# Patient Record
Sex: Female | Born: 2000 | Race: White | Hispanic: No | Marital: Single | Smoking: Never smoker
Health system: Southern US, Community
[De-identification: ages and names within clinical notes are randomized; demographics above are authoritative.]

## PROBLEM LIST (undated history)

## (undated) DIAGNOSIS — R278 Other lack of coordination: Secondary | ICD-10-CM

## (undated) DIAGNOSIS — F902 Attention-deficit hyperactivity disorder, combined type: Secondary | ICD-10-CM

## (undated) HISTORY — DX: Other lack of coordination: R27.8

## (undated) HISTORY — DX: Attention-deficit hyperactivity disorder, combined type: F90.2

---
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
## 2022-01-09 DIAGNOSIS — J209 Acute bronchitis, unspecified: Secondary | ICD-10-CM | POA: Diagnosis not present

## 2022-01-09 DIAGNOSIS — R0602 Shortness of breath: Secondary | ICD-10-CM | POA: Diagnosis not present

## 2022-01-20 DIAGNOSIS — H5213 Myopia, bilateral: Secondary | ICD-10-CM | POA: Diagnosis not present

## 2022-03-27 DIAGNOSIS — L299 Pruritus, unspecified: Secondary | ICD-10-CM | POA: Diagnosis not present

## 2022-03-27 DIAGNOSIS — J309 Allergic rhinitis, unspecified: Secondary | ICD-10-CM | POA: Diagnosis not present

## 2022-08-06 DIAGNOSIS — S90862A Insect bite (nonvenomous), left foot, initial encounter: Secondary | ICD-10-CM | POA: Diagnosis not present

## 2022-08-06 DIAGNOSIS — W57XXXA Bitten or stung by nonvenomous insect and other nonvenomous arthropods, initial encounter: Secondary | ICD-10-CM | POA: Diagnosis not present

## 2022-12-04 DIAGNOSIS — R112 Nausea with vomiting, unspecified: Secondary | ICD-10-CM | POA: Diagnosis not present

## 2022-12-04 DIAGNOSIS — R42 Dizziness and giddiness: Secondary | ICD-10-CM | POA: Diagnosis not present

## 2022-12-04 DIAGNOSIS — Z3202 Encounter for pregnancy test, result negative: Secondary | ICD-10-CM | POA: Diagnosis not present

## 2022-12-22 DIAGNOSIS — Z0000 Encounter for general adult medical examination without abnormal findings: Secondary | ICD-10-CM | POA: Diagnosis not present

## 2022-12-22 DIAGNOSIS — Z1159 Encounter for screening for other viral diseases: Secondary | ICD-10-CM | POA: Diagnosis not present

## 2022-12-22 DIAGNOSIS — Z113 Encounter for screening for infections with a predominantly sexual mode of transmission: Secondary | ICD-10-CM | POA: Diagnosis not present

## 2022-12-22 DIAGNOSIS — N760 Acute vaginitis: Secondary | ICD-10-CM | POA: Diagnosis not present

## 2022-12-22 DIAGNOSIS — Z118 Encounter for screening for other infectious and parasitic diseases: Secondary | ICD-10-CM | POA: Diagnosis not present

## 2023-02-01 DIAGNOSIS — Z3009 Encounter for other general counseling and advice on contraception: Secondary | ICD-10-CM | POA: Diagnosis not present

## 2023-02-01 DIAGNOSIS — N925 Other specified irregular menstruation: Secondary | ICD-10-CM | POA: Diagnosis not present

## 2023-02-01 DIAGNOSIS — Z3202 Encounter for pregnancy test, result negative: Secondary | ICD-10-CM | POA: Diagnosis not present

## 2023-02-20 DIAGNOSIS — L309 Dermatitis, unspecified: Secondary | ICD-10-CM | POA: Diagnosis not present

## 2023-03-05 ENCOUNTER — Ambulatory Visit
Admission: EM | Admit: 2023-03-05 | Discharge: 2023-03-05 | Disposition: A | Discharge status: Home / Self Care | Payer: Medicaid Other

## 2023-03-05 ENCOUNTER — Encounter: Payer: Self-pay | Admitting: Emergency Medicine

## 2023-03-05 DIAGNOSIS — J0100 Acute maxillary sinusitis, unspecified: Secondary | ICD-10-CM

## 2023-03-05 DIAGNOSIS — J309 Allergic rhinitis, unspecified: Secondary | ICD-10-CM

## 2023-03-05 DIAGNOSIS — R059 Cough, unspecified: Secondary | ICD-10-CM

## 2023-03-05 LAB — POCT URINALYSIS DIP (MANUAL ENTRY)
Glucose, UA: NEGATIVE mg/dL
Leukocytes, UA: NEGATIVE
Nitrite, UA: NEGATIVE
Protein Ur, POC: 30 mg/dL — AB
Spec Grav, UA: >=1.03 — AB (ref 1.010–1.025)
Urobilinogen, UA: 0.2 E.U./dL
pH, UA: 6 (ref 5.0–8.0)

## 2023-03-05 MED ORDER — FEXOFENADINE HCL 180 MG PO TABS: 180.0000 mg | ORAL_TABLET | Freq: Every day | ORAL | 0 refills | Status: AC

## 2023-03-05 MED ORDER — AMOXICILLIN-POT CLAVULANATE 875-125 MG PO TABS: 1.0000 | ORAL_TABLET | Freq: Two times a day (BID) | ORAL | 0 refills | Status: AC

## 2023-03-05 MED ORDER — BENZONATATE 200 MG PO CAPS: 200.0000 mg | ORAL_CAPSULE | Freq: Three times a day (TID) | ORAL | 0 refills | Status: AC | PRN

## 2023-03-05 MED ORDER — PREDNISONE 20 MG PO TABS: ORAL_TABLET | ORAL | 0 refills | Status: DC

## 2023-03-05 NOTE — ED Triage Notes (Signed)
Abdominal pain, congestion since Friday N/V since Thursday  Pt changed to oral contraception  at the beginning at April  Diarrhea x 3 today  No nausea in triage

## 2023-03-05 NOTE — ED Provider Notes (Signed)
[facility]    CSN: [id number] Arrival date & time: 03/05/23  1440      History   Chief Complaint Chief Complaint  Patient presents with   Abdominal Pain    HPI Christine Yoder is a 22 y.o. female.   HPI 22 year old female presents with nasal sinus congestion and cough for 4 days.   PMH significant for ADHD.  Past Medical History:  Diagnosis Date   ADHD (attention deficit hyperactivity disorder), combined type 02/18/2016   Dysgraphia 02/18/2016    Patient Active Problem List   Diagnosis Date Noted   ADHD (attention deficit hyperactivity disorder), combined type 02/18/2016   Dysgraphia 02/18/2016    History reviewed. No pertinent surgical history.  OB History   No obstetric history on file.      Home Medications    Prior to Admission medications   Medication Sig Start Date End Date Taking? Authorizing Provider  amoxicillin-clavulanate (AUGMENTIN) 875-125 MG tablet Take 1 tablet by mouth 2 (two) times daily for 10 days. 03/05/23 03/15/23 Yes Trevor Iha, FNP  benzonatate (TESSALON) 200 MG capsule Take 1 capsule (200 mg total) by mouth 3 (three) times daily as needed for up to 7 days. 03/05/23 03/12/23 Yes Trevor Iha, FNP  drospirenone-ethinyl estradiol Fatima Blank) 3-0.03 MG tablet Take by mouth. 02/01/23  Yes [provider]  fexofenadine (ALLEGRA ALLERGY) 180 MG tablet Take 1 tablet (180 mg total) by mouth daily for 15 days. 03/05/23 03/20/23 Yes Trevor Iha, FNP  predniSONE (DELTASONE) 20 MG tablet Take 3 tabs PO daily x 5 days. 03/05/23  Yes Trevor Iha, FNP  etonogestrel (NEXPLANON) 68 MG IMPL implant 1 each by Subdermal route once. Patient not taking: Reported on 03/05/2023    [provider]  guanFACINE (INTUNIV) 4 MG TB24 ER tablet TAKE 1 TABLET BY MOUTH EVERY MORNING. Patient not taking: Reported on 03/05/2023 08/17/21   Lorina Rabon, NP  methylphenidate (CONCERTA) 54 MG PO CR tablet Take 1 tablet (54 mg total) by mouth every  morning. Patient not taking: Reported on 03/05/2023 08/18/21   Leticia Penna, NP    Family History Family History  Problem Relation Age of Onset   Heart disease Paternal Grandmother        Dec 2017 sudden MI    Social History Social History   Tobacco Use   Smoking status: Never    Passive exposure: Yes   Smokeless tobacco: Never  Vaping Use   Vaping Use: Never used  Substance Use Topics   Alcohol use: No    Alcohol/week: 0.0 standard drinks of alcohol   Drug use: No     Allergies   Patient has no known allergies.   Review of Systems Review of Systems  All other systems reviewed and are negative.    Physical Exam Triage Vital Signs ED Triage Vitals  Enc Vitals Group     BP      Pulse      Resp      Temp      Temp src      SpO2      Weight      Height      Head Circumference      Peak Flow      Pain Score      Pain Loc      Pain Edu?      Excl. in GC?    No data found.  Updated Vital Signs BP 109/73 (BP Location: Right Arm)  Pulse 83   Temp 98.3 F (36.8 C) (Oral)   Resp 16   Wt 153 lb (69.4 kg)   LMP 03/02/2023 (Exact Date)   SpO2 98%   Physical Exam Vitals and nursing note reviewed.  Constitutional:      General: She is not in acute distress.    Appearance: Normal appearance. She is normal weight. She is not ill-appearing.  HENT:     Head: Normocephalic and atraumatic.     Right Ear: Tympanic membrane, ear canal and external ear normal.     Left Ear: Tympanic membrane, ear canal and external ear normal.     Nose: Nose normal.     Comments: Turbinates are erythematous/edematous with clear rhinorrhea noted    Mouth/Throat:     Mouth: Mucous membranes are moist.     Pharynx: Oropharynx is clear.     Comments: Significant amount of clear drainage of posterior oropharynx Eyes:     Extraocular Movements: Extraocular movements intact.     Conjunctiva/sclera: Conjunctivae normal.     Pupils: Pupils are equal, round, and reactive to light.   Cardiovascular:     Rate and Rhythm: Normal rate and regular rhythm.     Pulses: Normal pulses.     Heart sounds: Normal heart sounds. No murmur heard. Pulmonary:     Effort: Pulmonary effort is normal.     Breath sounds: Normal breath sounds. No wheezing, rhonchi or rales.     Comments: Infrequent nonproductive cough noted on exam Abdominal:     General: Bowel sounds are normal.  Musculoskeletal:        General: Normal range of motion.     Cervical back: Normal range of motion and neck supple.  Skin:    General: Skin is warm and dry.  Neurological:     General: No focal deficit present.     Mental Status: She is alert and oriented to person, place, and time. Mental status is at baseline.      UC Treatments / Results  Labs (all labs ordered are listed, but only abnormal results are displayed) Labs Reviewed  POCT URINALYSIS DIP (MANUAL ENTRY) - Abnormal; Notable for the following components:      Result Value   Clarity, UA cloudy (*)    Bilirubin, UA small (*)    Ketones, POC UA small (15) (*)    Spec Grav, UA >=1.030 (*)    Blood, UA large (*)    Protein Ur, POC =30 (*)    All other components within normal limits    EKG   Radiology No results found.  Procedures Procedures (including critical care time)  Medications Ordered in UC Medications - No data to display  Initial Impression / Assessment and Plan / UC Course  I have reviewed the triage vital signs and the nursing notes.  Pertinent labs & imaging results that were available during my care of the patient were reviewed by me and considered in my medical decision making (see chart for details).     MDM: 1.  Acute maxillary sinusitis, recurrence not specified-Rx'd Augmentin 875/125 mg twice daily x 10 days; 2.  Cough, unspecified type-Rx prednisone 60 mg daily x 5 days, Tessalon Perles 200 mg 3 times daily, as needed; 3.  Allergic rhinitis, unspecified seasonality, unspecified trigger-Rx'd Allegra 180 mg  daily x 5 days, then as needed for concurrent postnasal drainage/drip. Instructed patient to take medications as directed with food to completion.  Advised patient to take Allegra and prednisone  with first dose of Augmentin for the next 5 of 10 days.  Advised may use Allegra as needed afterwards for concurrent postnasal drip/drainage.  Advised may use Tessalon daily or as needed for cough.  Encouraged increase daily water intake to 64 ounces per day while taking these medications. Advised if symptoms worsen and/or unresolved please follow-up with PCP or here for further evaluation.  Patient advised of UA analysis prior to discharge encouraged increased daily water intake patient is currently on menstrual cycle.  Patient discharged home, hemodynamically stable.  Work note provided to patient prior to discharge per request. Final Clinical Impressions(s) / UC Diagnoses   Final diagnoses:  Acute maxillary sinusitis, recurrence not specified  Allergic rhinitis, unspecified seasonality, unspecified trigger  Cough, unspecified type     Discharge Instructions      Instructed patient to take medications as directed with food to completion.  Advised patient to take Allegra and prednisone with first dose of Augmentin for the next 5 of 10 days.  Advised may use Allegra as needed afterwards for concurrent postnasal drip/drainage.  Advised may use Tessalon daily or as needed for cough.  Encouraged increase daily water intake to 64 ounces per day while taking these medications. Advised if symptoms worsen and/or unresolved please follow-up with PCP or here for further evaluation.     ED Prescriptions     Medication Sig Dispense Auth. Provider   amoxicillin-clavulanate (AUGMENTIN) 875-125 MG tablet Take 1 tablet by mouth 2 (two) times daily for 10 days. 20 tablet Trevor Iha, FNP   predniSONE (DELTASONE) 20 MG tablet Take 3 tabs PO daily x 5 days. 15 tablet Trevor Iha, FNP   fexofenadine [facility]  ALLERGY) 180 MG tablet Take 1 tablet (180 mg total) by mouth daily for 15 days. 15 tablet Trevor Iha, FNP   benzonatate (TESSALON) 200 MG capsule Take 1 capsule (200 mg total) by mouth 3 (three) times daily as needed for up to 7 days. 40 capsule Trevor Iha, FNP      PDMP not reviewed this encounter.   Trevor Iha, FNP 03/05/23 1537

## 2023-03-05 NOTE — Discharge Instructions (Addendum)
Instructed patient to take medications as directed with food to completion.  Advised patient to take Allegra and prednisone with first dose of Augmentin for the next 5 of 10 days.  Advised may use Allegra as needed afterwards for concurrent postnasal drip/drainage.  Advised may use Tessalon daily or as needed for cough.  Encouraged increase daily water intake to 64 ounces per day while taking these medications. Advised if symptoms worsen and/or unresolved please follow-up with PCP or here for further evaluation.

## 2023-03-12 ENCOUNTER — Telehealth: Payer: Self-pay

## 2023-03-12 NOTE — Telephone Encounter (Signed)
LVM for patient to call back. AS, CMA 

## 2023-06-08 DIAGNOSIS — A084 Viral intestinal infection, unspecified: Secondary | ICD-10-CM | POA: Diagnosis not present

## 2023-06-08 DIAGNOSIS — Z3202 Encounter for pregnancy test, result negative: Secondary | ICD-10-CM | POA: Diagnosis not present

## 2023-06-08 DIAGNOSIS — R1110 Vomiting, unspecified: Secondary | ICD-10-CM | POA: Diagnosis not present

## 2023-12-08 DIAGNOSIS — J069 Acute upper respiratory infection, unspecified: Secondary | ICD-10-CM | POA: Diagnosis not present

## 2023-12-08 DIAGNOSIS — J029 Acute pharyngitis, unspecified: Secondary | ICD-10-CM | POA: Diagnosis not present

## 2023-12-31 DIAGNOSIS — N911 Secondary amenorrhea: Secondary | ICD-10-CM | POA: Diagnosis not present

## 2023-12-31 DIAGNOSIS — R635 Abnormal weight gain: Secondary | ICD-10-CM | POA: Diagnosis not present

## 2024-02-04 DIAGNOSIS — Z118 Encounter for screening for other infectious and parasitic diseases: Secondary | ICD-10-CM | POA: Diagnosis not present

## 2024-02-04 DIAGNOSIS — Z113 Encounter for screening for infections with a predominantly sexual mode of transmission: Secondary | ICD-10-CM | POA: Diagnosis not present

## 2024-02-04 DIAGNOSIS — Z0000 Encounter for general adult medical examination without abnormal findings: Secondary | ICD-10-CM | POA: Diagnosis not present

## 2024-02-04 DIAGNOSIS — N912 Amenorrhea, unspecified: Secondary | ICD-10-CM | POA: Diagnosis not present

## 2024-03-24 ENCOUNTER — Encounter (HOSPITAL_COMMUNITY): Payer: Self-pay

## 2024-03-24 ENCOUNTER — Ambulatory Visit (HOSPITAL_COMMUNITY)
Admission: EM | Admit: 2024-03-24 | Discharge: 2024-03-24 | Disposition: A | Discharge status: Home / Self Care | Attending: Family Medicine | Admitting: Family Medicine

## 2024-03-24 DIAGNOSIS — K529 Noninfective gastroenteritis and colitis, unspecified: Secondary | ICD-10-CM | POA: Diagnosis not present

## 2024-03-24 LAB — POCT URINALYSIS DIP (MANUAL ENTRY)
Blood, UA: NEGATIVE
Glucose, UA: NEGATIVE mg/dL
Leukocytes, UA: NEGATIVE
Nitrite, UA: NEGATIVE
Protein Ur, POC: 30 mg/dL — AB
Spec Grav, UA: 1.025 (ref 1.010–1.025)
Urobilinogen, UA: 0.2 U/dL
pH, UA: 6 (ref 5.0–8.0)

## 2024-03-24 LAB — POCT URINE PREGNANCY: Preg Test, Ur: NEGATIVE

## 2024-03-24 MED ORDER — ONDANSETRON 4 MG PO TBDP
4.0000 mg | ORAL_TABLET | Freq: Once | ORAL | Status: AC
  Administered 2024-03-24: 4 mg via ORAL

## 2024-03-24 MED ORDER — ONDANSETRON 4 MG PO TBDP: 4.0000 mg | ORAL_TABLET | Freq: Three times a day (TID) | ORAL | 0 refills | Status: DC | PRN

## 2024-03-24 MED ORDER — ONDANSETRON 4 MG PO TBDP
ORAL_TABLET | ORAL | Status: AC
  Filled 2024-03-24: qty 1

## 2024-03-24 NOTE — ED Provider Notes (Signed)
 [facility]   Note:  This document was prepared using Dragon voice recognition software and may include unintentional dictation errors.  MRN: [id number] DOB: July 02, 2001  Subjective:   Christine Yoder is a 23 y.o. female presenting for nausea/vomiting and abdominal cramping since Friday.  Patient reports that she began having diminished appetite on Friday and when she woke up on Saturday and began to eat she immediately had an episode of vomiting.  Patient reports constant abdominal cramping since vomiting began.  Patient denies any diarrhea, dysuria, increased urinary frequency, flank pain, fever.  Patient has not taken any over-the-counter or previously prescribed medication to treat nausea and vomiting symptoms or cramping.  Patient has past history of acid reflux and currently is taking omeprazole for symptoms.  No current facility-administered medications for this encounter.  Current Outpatient Medications:    ondansetron  (ZOFRAN -ODT) 4 MG disintegrating tablet, Take 1 tablet (4 mg total) by mouth every 8 (eight) hours as needed for nausea or vomiting., Disp: 20 tablet, Rfl: 0   drospirenone-ethinyl estradiol (YASMIN) 3-0.03 MG tablet, Take by mouth., Disp: , Rfl:    etonogestrel  (NEXPLANON ) 68 MG IMPL implant, 1 each by Subdermal route once. (Patient not taking: Reported on 03/05/2023), Disp: , Rfl:    fexofenadine  (ALLEGRA  ALLERGY) 180 MG tablet, Take 1 tablet (180 mg total) by mouth daily for 15 days., Disp: 15 tablet, Rfl: 0   guanFACINE  (INTUNIV ) 4 MG TB24 ER tablet, TAKE 1 TABLET BY MOUTH EVERY MORNING. (Patient not taking: Reported on 03/05/2023), Disp: 90 tablet, Rfl: 0   methylphenidate  (CONCERTA ) 54 MG PO CR tablet, Take 1 tablet (54 mg total) by mouth every morning. (Patient not taking: Reported on 03/05/2023), Disp: 30 tablet, Rfl: 0   predniSONE  (DELTASONE ) 20 MG tablet, Take 3 tabs PO daily x 5 days., Disp: 15 tablet, Rfl: 0   No Known Allergies  Past Medical  History:  Diagnosis Date   ADHD (attention deficit hyperactivity disorder), combined type 02/18/2016   Dysgraphia 02/18/2016     History reviewed. No pertinent surgical history.  Family History  Problem Relation Age of Onset   Heart disease Paternal Grandmother        Dec 2017 sudden MI    Social History   Tobacco Use   Smoking status: Never    Passive exposure: Yes   Smokeless tobacco: Never  Vaping Use   Vaping status: Never Used  Substance Use Topics   Alcohol use: No    Alcohol/week: 0.0 standard drinks of alcohol   Drug use: No    ROS Refer to HPI for ROS details.  Objective:   Vitals: BP 126/83 (BP Location: Right Arm)   Pulse (!) 58   Temp 98.3 F (36.8 C) (Oral)   Resp 14   LMP 03/11/2024 (Approximate)   SpO2 98%   Physical Exam Vitals and nursing note reviewed.  Constitutional:      General: She is not in acute distress.    Appearance: She is well-developed. She is not ill-appearing or toxic-appearing.  HENT:     Head: Normocephalic and atraumatic.     Mouth/Throat:     Mouth: Mucous membranes are moist.  Cardiovascular:     Rate and Rhythm: Normal rate.  Pulmonary:     Effort: Pulmonary effort is normal. No respiratory distress.  Abdominal:     General: There is no distension.     Palpations: Abdomen is soft.     Tenderness: There is no abdominal tenderness. There is no right CVA  tenderness, left CVA tenderness, guarding or rebound.  Skin:    General: Skin is warm and dry.  Neurological:     General: No focal deficit present.     Mental Status: She is alert and oriented to person, place, and time.  Psychiatric:        Mood and Affect: Mood normal.        Behavior: Behavior normal.     Procedures  Results for orders placed or performed during the hospital encounter of 03/24/24 (from the past 24 hours)  POCT urine pregnancy     Status: None   Collection Time: 03/24/24  8:15 PM  Result Value Ref Range   Preg Test, Ur Negative Negative   POCT urinalysis dipstick     Status: Abnormal   Collection Time: 03/24/24  8:15 PM  Result Value Ref Range   Color, UA orange (A) yellow   Clarity, UA hazy (A) clear   Glucose, UA negative negative mg/dL   Bilirubin, UA moderate (A) negative   Ketones, POC UA >= (160) (A) negative mg/dL   Spec Grav, UA 1.610 9.604 - 1.025   Blood, UA negative negative   pH, UA 6.0 5.0 - 8.0   Protein Ur, POC =30 (A) negative mg/dL   Urobilinogen, UA 0.2 0.2 or 1.0 E.U./dL   Nitrite, UA Negative Negative   Leukocytes, UA Negative Negative    No results found.   Assessment and Plan :     Discharge Instructions       1. Gastroenteritis (Primary) - POCT urine pregnancy performed in UC is negative - POCT urinalysis dipstick shows no leukocytes, no nitrite, no blood, no sign of urinary tract infection - ondansetron  (ZOFRAN -ODT) disintegrating tablet 4 mg given in clinic for acute nausea and vomiting - ondansetron  (ZOFRAN -ODT) 4 MG disintegrating tablet; Take 1 tablet (4 mg total) by mouth every 8 (eight) hours as needed for nausea or vomiting.  Dispense: 20 tablet; Refill: 0 -Continue to monitor symptoms for any change in severity if there is any escalation of current symptoms or development of new symptoms follow-up in ER for further evaluation and management.    [name] B [name]   [name], [place], [place] 03/24/24 2043

## 2024-03-24 NOTE — ED Triage Notes (Signed)
 Patient c/o vomiting, chills, abdominal cramping x 4 days. Patient states she is able to tolerate water and some fruit. Patient states she only has N/V when she tries to eat. Patient drinking water in triage.   Patient states she has nota taken anything for her symptoms.

## 2024-03-24 NOTE — ED Notes (Signed)
  Patient reports that she threw the Zofran  up that she put under her tongue because she couldn't stand the taste.

## 2024-03-24 NOTE — Discharge Instructions (Addendum)
  1. Gastroenteritis (Primary) - POCT urine pregnancy performed in UC is negative - POCT urinalysis dipstick shows no leukocytes, no nitrite, no blood, no sign of urinary tract infection - ondansetron  (ZOFRAN -ODT) disintegrating tablet 4 mg given in clinic for acute nausea and vomiting - ondansetron  (ZOFRAN -ODT) 4 MG disintegrating tablet; Take 1 tablet (4 mg total) by mouth every 8 (eight) hours as needed for nausea or vomiting.  Dispense: 20 tablet; Refill: 0 -Continue to monitor symptoms for any change in severity if there is any escalation of current symptoms or development of new symptoms follow-up in ER for further evaluation and management.

## 2024-03-28 ENCOUNTER — Ambulatory Visit
Admission: RE | Admit: 2024-03-28 | Discharge: 2024-03-28 | Disposition: A | Discharge status: Home / Self Care | Source: Ambulatory Visit | Attending: Family Medicine | Admitting: Family Medicine

## 2024-03-28 VITALS — BP 137/89 | HR 82 | Temp 98.4°F | Resp 17 | Ht 63.0 in | Wt 159.0 lb

## 2024-03-28 DIAGNOSIS — R110 Nausea: Secondary | ICD-10-CM

## 2024-03-28 DIAGNOSIS — R1011 Right upper quadrant pain: Secondary | ICD-10-CM | POA: Diagnosis not present

## 2024-03-28 LAB — POCT URINALYSIS DIP (MANUAL ENTRY)
Blood, UA: NEGATIVE
Glucose, UA: NEGATIVE mg/dL
Leukocytes, UA: NEGATIVE
Nitrite, UA: NEGATIVE
Protein Ur, POC: 100 mg/dL — AB
Spec Grav, UA: 1.025 (ref 1.010–1.025)
Urobilinogen, UA: 1 U/dL
pH, UA: 6 (ref 5.0–8.0)

## 2024-03-28 LAB — POCT URINE PREGNANCY: Preg Test, Ur: NEGATIVE

## 2024-03-28 MED ORDER — PROMETHAZINE HCL 25 MG PO TABS: 25.0000 mg | ORAL_TABLET | Freq: Four times a day (QID) | ORAL | 1 refills | Status: AC | PRN

## 2024-03-28 NOTE — Discharge Instructions (Signed)
 Begin clear liquids during the weekend.  If symptoms become significantly worse during the night or over the weekend, proceed to the local emergency room.

## 2024-03-28 NOTE — ED Triage Notes (Signed)
 Pt states that she has some nausea and vomiting. X5 days  Pt states that she has been running a fever as well.

## 2024-03-28 NOTE — ED Provider Notes (Signed)
 [facility]    CSN: [id number] Arrival date & time: 03/28/24  1720      History   Chief Complaint Chief Complaint  Patient presents with   Nausea    Been vomiting for 5 days can't take zolfran Can't eat anything without throwing it up - Entered by patient    HPI Christine Yoder is a 23 y.o. female.   Patient complains of persistent nausea, abdominal cramps, and occasional vomiting for about a week. During the past two days she has had chills/sweats and low grade fever 100.2. She notes that she has been able to tolerate some liquids.  Zofran  has not been very helpful for her nausea.  She was evaluated in the [facility] urgent care four days ago for symptoms that had developed about 3 to 4 days prior.  At that time a urine pregnancy test was negative and urinalysis unremarkable except for moderate bilirubin.  Patient's last menstrual period was 03/11/2024 (approximate).  She has history of GERD for which she takes OTC omeprazole.  She denies urinary symptoms, pelvic pain, vaginal discharge, and changes in bowel movements.  The history is provided by the patient.    Past Medical History:  Diagnosis Date   ADHD (attention deficit hyperactivity disorder), combined type 02/18/2016   Dysgraphia 02/18/2016    Patient Active Problem List   Diagnosis Date Noted   ADHD (attention deficit hyperactivity disorder), combined type 02/18/2016   Dysgraphia 02/18/2016    History reviewed. No pertinent surgical history.  OB History   No obstetric history on file.      Home Medications    Prior to Admission medications   Medication Sig Start Date End Date Taking? Authorizing Provider  drospirenone-ethinyl estradiol (YASMIN) 3-0.03 MG tablet Take by mouth. 02/01/23  Yes [provider]  ondansetron  (ZOFRAN -ODT) 4 MG disintegrating tablet Take 1 tablet (4 mg total) by mouth every 8 (eight) hours as needed for nausea or vomiting. 03/24/24  Yes Reddick, Johnathan  B, NP  predniSONE  (DELTASONE ) 20 MG tablet Take 3 tabs PO daily x 5 days. 03/05/23  Yes Leonides Ramp, FNP  promethazine (PHENERGAN) 25 MG tablet Take 1 tablet (25 mg total) by mouth every 6 (six) hours as needed for nausea or vomiting. 03/28/24  Yes Leon Rajas, MD  etonogestrel  (NEXPLANON ) 68 MG IMPL implant 1 each by Subdermal route once. Patient not taking: Reported on 03/05/2023    [provider]  fexofenadine  (ALLEGRA  ALLERGY) 180 MG tablet Take 1 tablet (180 mg total) by mouth daily for 15 days. 03/05/23 03/20/23  Leonides Ramp, FNP  guanFACINE  (INTUNIV ) 4 MG TB24 ER tablet TAKE 1 TABLET BY MOUTH EVERY MORNING. Patient not taking: Reported on 03/05/2023 08/17/21   Jocelyne Mull, NP  methylphenidate  (CONCERTA ) 54 MG PO CR tablet Take 1 tablet (54 mg total) by mouth every morning. Patient not taking: Reported on 03/05/2023 08/18/21   Sherrell Dodrill, NP    Family History Family History  Problem Relation Age of Onset   Heart disease Paternal Grandmother        Dec 2017 sudden MI    Social History Social History   Tobacco Use   Smoking status: Never    Passive exposure: Yes   Smokeless tobacco: Never  Vaping Use   Vaping status: Former  Substance Use Topics   Alcohol use: No    Alcohol/week: 0.0 standard drinks of alcohol   Drug use: No     Allergies   Patient  has no known allergies.   Review of Systems Review of Systems  Constitutional:  Positive for activity change, appetite change, chills, diaphoresis, fatigue and fever.  HENT: Negative.    Eyes: Negative.   Respiratory: Negative.    Cardiovascular: Negative.   Gastrointestinal:  Positive for nausea and vomiting. Negative for abdominal distention, abdominal pain, blood in stool, constipation and diarrhea.  Genitourinary: Negative.   Musculoskeletal: Negative.   Skin: Negative.   Neurological: Negative.      Physical Exam Triage Vital Signs ED Triage Vitals  Encounter Vitals Group     BP  03/28/24 1738 137/89     Systolic BP Percentile --      Diastolic BP Percentile --      Pulse Rate 03/28/24 1738 82     Resp 03/28/24 1738 17     Temp 03/28/24 1738 98.4 F (36.9 C)     Temp Source 03/28/24 1738 Oral     SpO2 03/28/24 1738 96 %     Weight 03/28/24 1736 159 lb (72.1 kg)     Height 03/28/24 1736 5\' 3"  (1.6 m)     Head Circumference --      Peak Flow --      Pain Score 03/28/24 1736 0     Pain Loc --      Pain Education --      Exclude from Growth Chart --    No data found.  Updated Vital Signs BP 137/89 (BP Location: Right Arm)   Pulse 82   Temp 98.4 F (36.9 C) (Oral)   Resp 17   Ht 5\' 3"  (1.6 m)   Wt 72.1 kg   LMP 03/11/2024 (Approximate)   SpO2 96%   BMI 28.17 kg/m   Visual Acuity Right Eye Distance:   Left Eye Distance:   Bilateral Distance:    Right Eye Near:   Left Eye Near:    Bilateral Near:     Physical Exam Vitals and nursing note reviewed.  Constitutional:      General: She is not in acute distress.    Appearance: She is not ill-appearing.  HENT:     Head: Normocephalic.     Mouth/Throat:     Mouth: Mucous membranes are moist.  Eyes:     Conjunctiva/sclera: Conjunctivae normal.     Pupils: Pupils are equal, round, and reactive to light.  Cardiovascular:     Rate and Rhythm: Normal rate and regular rhythm.     Heart sounds: Normal heart sounds.  Pulmonary:     Effort: Pulmonary effort is normal.     Breath sounds: Normal breath sounds.  Abdominal:     General: Abdomen is flat. Bowel sounds are normal. There is no distension.     Palpations: Abdomen is soft. There is no hepatomegaly or splenomegaly.     Tenderness: There is abdominal tenderness in the right upper quadrant. There is no right CVA tenderness, left CVA tenderness, guarding or rebound. Positive signs include Murphy's sign. Negative signs include Rovsing's sign and McBurney's sign.     Hernia: No hernia is present.    Musculoskeletal:     Cervical back: Neck  supple.  Lymphadenopathy:     Cervical: No cervical adenopathy.  Neurological:     Mental Status: She is alert.      UC Treatments / Results  Labs (all labs ordered are listed, but only abnormal results are displayed) Labs Reviewed  POCT URINALYSIS DIP (MANUAL ENTRY) - Abnormal; Notable for the  following components:      Result Value   Color, UA red (*)    Bilirubin, UA moderate (*)    Ketones, POC UA moderate (40) (*)    Protein Ur, POC =100 (*)    All other components within normal limits  POCT URINE PREGNANCY - Normal    EKG   Radiology No results found.  Procedures Procedures (including critical care time)  Medications Ordered in UC Medications - No data to display  Initial Impression / Assessment and Plan / UC Course  I have reviewed the triage vital signs and the nursing notes.  Pertinent labs & imaging results that were available during my care of the patient were reviewed by me and considered in my medical decision making (see chart for details).    Note persistent moderate bilirubinuria. Suspect biliary colic.  Rx for Phenergan 25mg  for nausea. Schedule [facility] US  [facility].  Followup here after scan.  Final Clinical Impressions(s) / UC Diagnoses   Final diagnoses:  Right upper quadrant abdominal pain  Nausea     Discharge Instructions      Begin clear liquids during the weekend.  If symptoms become significantly worse during the night or over the weekend, proceed to the local emergency room.    ED Prescriptions     Medication Sig Dispense Auth. Provider   promethazine (PHENERGAN) 25 MG tablet Take 1 tablet (25 mg total) by mouth every 6 (six) hours as needed for nausea or vomiting. 15 tablet Leon Rajas, MD         Leon Rajas, MD 03/29/24 1310

## 2024-04-02 ENCOUNTER — Ambulatory Visit

## 2024-04-02 DIAGNOSIS — R1011 Right upper quadrant pain: Secondary | ICD-10-CM

## 2024-04-03 ENCOUNTER — Ambulatory Visit (HOSPITAL_COMMUNITY): Payer: Self-pay

## 2024-04-03 ENCOUNTER — Telehealth: Payer: Self-pay

## 2024-04-03 NOTE — Telephone Encounter (Signed)
 Per Dr. Nicholette Barley US  results normal

## 2024-04-09 ENCOUNTER — Ambulatory Visit
Admission: RE | Admit: 2024-04-09 | Discharge: 2024-04-09 | Disposition: A | Discharge status: Home / Self Care | Source: Ambulatory Visit | Attending: Family Medicine | Admitting: Family Medicine

## 2024-04-09 VITALS — BP 130/67 | HR 88 | Temp 100.4°F | Resp 16

## 2024-04-09 DIAGNOSIS — R509 Fever, unspecified: Secondary | ICD-10-CM

## 2024-04-09 DIAGNOSIS — R0981 Nasal congestion: Secondary | ICD-10-CM

## 2024-04-09 MED ORDER — ACETAMINOPHEN 500 MG PO TABS
1000.0000 mg | ORAL_TABLET | Freq: Four times a day (QID) | ORAL | Status: DC | PRN
Start: 2024-04-09 — End: 2024-04-10
  Administered 2024-04-09: 1000 mg via ORAL

## 2024-04-09 MED ORDER — PREDNISONE 20 MG PO TABS: ORAL_TABLET | ORAL | 0 refills | Status: AC

## 2024-04-09 NOTE — ED Triage Notes (Signed)
 Pt presents to [facility] with co cough congestion since Monday. Pt reports she has been taking otc vicks flu. Pt is concerned for a sinus infection.

## 2024-04-09 NOTE — Discharge Instructions (Addendum)
 Advised patient to take medication as directed with food to completion.  Advised patient may take OTC Tylenol  1000 mg every 6 hours for fever (oral temperature greater than 100.3).  Encouraged to increase daily water intake to 64 ounces per day while taking these medications.  Advised if symptoms worsen and/or unresolved please follow-up with your PCP or here for further evaluation.

## 2024-04-09 NOTE — ED Provider Notes (Signed)
 [facility]    CSN: [id number] Arrival date & time: 04/09/24  1850      History   Chief Complaint Chief Complaint  Patient presents with   Nasal Congestion    Feels like another sinus infection - Entered by patient    HPI Christine Yoder is a 23 y.o. female.   HPI 23 year old female presents with sinus nasal congestion, sinus pressure, and fever for 2 days.  PMH significant for obesity and ADHD.  Patient is accompanied by her 2 friends this evening.  Past Medical History:  Diagnosis Date   ADHD (attention deficit hyperactivity disorder), combined type 02/18/2016   Dysgraphia 02/18/2016    Patient Active Problem List   Diagnosis Date Noted   ADHD (attention deficit hyperactivity disorder), combined type 02/18/2016   Dysgraphia 02/18/2016    History reviewed. No pertinent surgical history.  OB History   No obstetric history on file.      Home Medications    Prior to Admission medications   Medication Sig Start Date End Date Taking? Authorizing Provider  predniSONE  (DELTASONE ) 20 MG tablet Take 3 tabs PO daily x 5 days. 04/09/24  Yes Leonides Ramp, FNP  drospirenone-ethinyl estradiol (YASMIN) 3-0.03 MG tablet Take by mouth. 02/01/23   [provider]  fexofenadine  (ALLEGRA  ALLERGY) 180 MG tablet Take 1 tablet (180 mg total) by mouth daily for 15 days. 03/05/23 03/20/23  Leonides Ramp, FNP  promethazine  (PHENERGAN ) 25 MG tablet Take 1 tablet (25 mg total) by mouth every 6 (six) hours as needed for nausea or vomiting. 03/28/24   Leon Rajas, MD    Family History Family History  Problem Relation Age of Onset   Healthy Mother    Healthy Father    Heart disease Paternal Grandmother        Dec 2017 sudden MI    Social History Social History   Tobacco Use   Smoking status: Never    Passive exposure: Yes   Smokeless tobacco: Never  Vaping Use   Vaping status: Former  Substance Use Topics   Alcohol use: No    Alcohol/week: 0.0 standard  drinks of alcohol   Drug use: No     Allergies   Patient has no known allergies.   Review of Systems Review of Systems  Constitutional:  Positive for fever.  HENT:  Positive for congestion and sinus pain.   All other systems reviewed and are negative.    Physical Exam Triage Vital Signs ED Triage Vitals  Encounter Vitals Group     BP      Systolic BP Percentile      Diastolic BP Percentile      Pulse      Resp      Temp      Temp src      SpO2      Weight      Height      Head Circumference      Peak Flow      Pain Score      Pain Loc      Pain Education      Exclude from Growth Chart    No data found.  Updated Vital Signs BP 130/67   Pulse 88   Temp (!) 100.4 F (38 C)   Resp 16   LMP 04/01/2024 (Approximate)   SpO2 98%    Physical Exam Vitals and nursing note reviewed.  Constitutional:      Appearance: Normal appearance. She is  obese. She is ill-appearing.  HENT:     Head: Normocephalic and atraumatic.     Right Ear: Tympanic membrane and external ear normal.     Left Ear: Tympanic membrane and external ear normal.     Ears:     Comments: Moderate eustachian tube dysfunction noted bilaterally    Mouth/Throat:     Mouth: Mucous membranes are moist.     Pharynx: Oropharynx is clear.  Eyes:     Extraocular Movements: Extraocular movements intact.     Conjunctiva/sclera: Conjunctivae normal.     Pupils: Pupils are equal, round, and reactive to light.  Cardiovascular:     Rate and Rhythm: Normal rate and regular rhythm.     Pulses: Normal pulses.     Heart sounds: Normal heart sounds.  Pulmonary:     Effort: Pulmonary effort is normal.     Breath sounds: Normal breath sounds. No wheezing, rhonchi or rales.  Musculoskeletal:        General: Normal range of motion.  Skin:    General: Skin is warm and dry.  Neurological:     General: No focal deficit present.     Mental Status: She is alert and oriented to person, place, and time. Mental  status is at baseline.  Psychiatric:        Mood and Affect: Mood normal.        Behavior: Behavior normal.      UC Treatments / Results  Labs (all labs ordered are listed, but only abnormal results are displayed) Labs Reviewed - No data to display  EKG   Radiology No results found.  Procedures Procedures (including critical care time)  Medications Ordered in UC Medications  acetaminophen (TYLENOL) tablet 1,000 mg (1,000 mg Oral Given 04/09/24 1921)    Initial Impression / Assessment and Plan / UC Course  I have reviewed the triage vital signs and the nursing notes.  Pertinent labs & imaging results that were available during my care of the patient were reviewed by me and considered in my medical decision making (see chart for details).     MDM: 1.  Fever, unspecified-Advised patient may take OTC Tylenol 1000 mg every 6 hours for fever (oral temperature greater than 100.3).  2.  Nasal congestion-Rx'd prednisone  20 mg tablet: Take 3 tablets p.o. daily x 5 days. Advised patient to take medication as directed with food to completion.  Advised patient may take OTC Tylenol 1000 mg every 6 hours for fever (oral temperature greater than 100.3).  Encouraged to increase daily water intake to 64 ounces per day while taking these medications.  Advised if symptoms worsen and/or unresolved please follow-up with your PCP or here for further evaluation.  Work note provided to patient prior to discharge.  Patient discharged home, hemodynamically stable. Final Clinical Impressions(s) / UC Diagnoses   Final diagnoses:  Fever, unspecified  Nasal congestion     Discharge Instructions      Advised patient to take medication as directed with food to completion.  Advised patient may take OTC Tylenol 1000 mg every 6 hours for fever (oral temperature greater than 100.3).  Encouraged to increase daily water intake to 64 ounces per day while taking these medications.  Advised if symptoms worsen  and/or unresolved please follow-up with your PCP or here for further evaluation.   ED Prescriptions     Medication Sig Dispense Auth. Provider   predniSONE  (DELTASONE ) 20 MG tablet Take 3 tabs PO daily x 5 days. 15  tablet Tarren Velardi, FNP      PDMP not reviewed this encounter.   Leonides Ramp, FNP 04/09/24 2003

## 2024-04-28 DIAGNOSIS — E282 Polycystic ovarian syndrome: Secondary | ICD-10-CM | POA: Diagnosis not present

## 2024-06-23 DIAGNOSIS — R1110 Vomiting, unspecified: Secondary | ICD-10-CM | POA: Diagnosis not present

## 2024-06-23 DIAGNOSIS — R112 Nausea with vomiting, unspecified: Secondary | ICD-10-CM | POA: Diagnosis not present

## 2024-06-30 DIAGNOSIS — N3001 Acute cystitis with hematuria: Secondary | ICD-10-CM | POA: Diagnosis not present

## 2024-06-30 DIAGNOSIS — R300 Dysuria: Secondary | ICD-10-CM | POA: Diagnosis not present

## 2024-09-27 DIAGNOSIS — Z9152 Personal history of nonsuicidal self-harm: Secondary | ICD-10-CM | POA: Diagnosis not present

## 2024-09-27 DIAGNOSIS — N946 Dysmenorrhea, unspecified: Secondary | ICD-10-CM | POA: Diagnosis not present

## 2024-09-27 DIAGNOSIS — Z630 Problems in relationship with spouse or partner: Secondary | ICD-10-CM | POA: Diagnosis not present

## 2024-09-27 DIAGNOSIS — F17290 Nicotine dependence, other tobacco product, uncomplicated: Secondary | ICD-10-CM | POA: Diagnosis not present

## 2024-09-27 DIAGNOSIS — R45851 Suicidal ideations: Secondary | ICD-10-CM | POA: Diagnosis not present

## 2024-09-27 DIAGNOSIS — F29 Unspecified psychosis not due to a substance or known physiological condition: Secondary | ICD-10-CM | POA: Diagnosis not present

## 2024-09-27 DIAGNOSIS — Z560 Unemployment, unspecified: Secondary | ICD-10-CM | POA: Diagnosis not present

## 2024-09-27 DIAGNOSIS — F4325 Adjustment disorder with mixed disturbance of emotions and conduct: Secondary | ICD-10-CM | POA: Diagnosis not present

## 2024-09-27 DIAGNOSIS — Z8659 Personal history of other mental and behavioral disorders: Secondary | ICD-10-CM | POA: Diagnosis not present

## 2024-09-27 DIAGNOSIS — Z79899 Other long term (current) drug therapy: Secondary | ICD-10-CM | POA: Diagnosis not present

## 2024-09-28 DIAGNOSIS — F39 Unspecified mood [affective] disorder: Secondary | ICD-10-CM | POA: Diagnosis not present

## 2024-09-28 DIAGNOSIS — R45851 Suicidal ideations: Secondary | ICD-10-CM | POA: Diagnosis not present

## 2024-09-29 DIAGNOSIS — R45851 Suicidal ideations: Secondary | ICD-10-CM | POA: Diagnosis not present

## 2024-09-29 DIAGNOSIS — F39 Unspecified mood [affective] disorder: Secondary | ICD-10-CM | POA: Diagnosis not present

## 2024-09-30 DIAGNOSIS — F4325 Adjustment disorder with mixed disturbance of emotions and conduct: Secondary | ICD-10-CM | POA: Diagnosis not present

## 2024-10-07 DIAGNOSIS — F4323 Adjustment disorder with mixed anxiety and depressed mood: Secondary | ICD-10-CM | POA: Diagnosis not present

## 2024-10-07 DIAGNOSIS — F17290 Nicotine dependence, other tobacco product, uncomplicated: Secondary | ICD-10-CM | POA: Diagnosis not present

## 2024-10-20 DIAGNOSIS — F17290 Nicotine dependence, other tobacco product, uncomplicated: Secondary | ICD-10-CM | POA: Diagnosis not present

## 2024-10-20 DIAGNOSIS — F4323 Adjustment disorder with mixed anxiety and depressed mood: Secondary | ICD-10-CM | POA: Diagnosis not present

## 2024-10-21 DIAGNOSIS — F4323 Adjustment disorder with mixed anxiety and depressed mood: Secondary | ICD-10-CM | POA: Diagnosis not present
